# Patient Record
Sex: Female | Born: 1965 | Race: White | Hispanic: No | Marital: Single | Smoking: Never smoker
Health system: Southern US, Community
[De-identification: ages and names within clinical notes are randomized; demographics above are authoritative.]

## PROBLEM LIST (undated history)

## (undated) DIAGNOSIS — R519 Headache, unspecified: Secondary | ICD-10-CM

## (undated) DIAGNOSIS — I10 Essential (primary) hypertension: Secondary | ICD-10-CM

## (undated) DIAGNOSIS — E039 Hypothyroidism, unspecified: Secondary | ICD-10-CM

## (undated) DIAGNOSIS — R51 Headache: Secondary | ICD-10-CM

## (undated) DIAGNOSIS — R011 Cardiac murmur, unspecified: Secondary | ICD-10-CM

## (undated) DIAGNOSIS — M1712 Unilateral primary osteoarthritis, left knee: Secondary | ICD-10-CM

## (undated) DIAGNOSIS — R112 Nausea with vomiting, unspecified: Secondary | ICD-10-CM

## (undated) DIAGNOSIS — Z973 Presence of spectacles and contact lenses: Secondary | ICD-10-CM

## (undated) DIAGNOSIS — E669 Obesity, unspecified: Secondary | ICD-10-CM

## (undated) DIAGNOSIS — Z9889 Other specified postprocedural states: Secondary | ICD-10-CM

## (undated) HISTORY — PX: BREAST REDUCTION SURGERY: SHX8

## (undated) HISTORY — PX: HEMORROIDECTOMY: SUR656

## (undated) HISTORY — PX: TONSILLECTOMY: SUR1361

## (undated) HISTORY — PX: ANTERIOR CRUCIATE LIGAMENT REPAIR: SHX115

## (undated) HISTORY — PX: COLONOSCOPY W/ BIOPSIES AND POLYPECTOMY: SHX1376

---

## 2002-06-09 ENCOUNTER — Inpatient Hospital Stay (HOSPITAL_COMMUNITY): Admission: AD | Admit: 2002-06-09 | Discharge: 2002-06-11 | Payer: Self-pay | Admitting: Obstetrics & Gynecology

## 2002-06-12 ENCOUNTER — Encounter: Admission: RE | Admit: 2002-06-12 | Discharge: 2002-07-12 | Payer: Self-pay | Admitting: Obstetrics and Gynecology

## 2002-07-24 ENCOUNTER — Other Ambulatory Visit: Admission: RE | Admit: 2002-07-24 | Discharge: 2002-07-24 | Payer: Self-pay | Admitting: Obstetrics & Gynecology

## 2004-01-13 ENCOUNTER — Other Ambulatory Visit: Admission: RE | Admit: 2004-01-13 | Discharge: 2004-01-13 | Payer: Self-pay | Admitting: Obstetrics & Gynecology

## 2007-06-27 ENCOUNTER — Encounter: Admission: RE | Admit: 2007-06-27 | Discharge: 2007-06-27 | Payer: Self-pay | Admitting: Obstetrics & Gynecology

## 2011-05-06 NOTE — H&P (Signed)
[facility]  PatientMYKELA, [name] Visit Number: [id number] MRN: [id number]          Service Type: OBS Location: 910A [id number] [id number] Attending Physician:  Lenoard Aden Dictated by:   Lenoard Aden, M.D. Admit Date:  06/09/2002   CC:         [facility]   History and Physical  CHIEF COMPLAINT:  Spontaneous fluid leakage.  HISTORY OF PRESENT ILLNESS: The patient is a 45 year old white female G1, P0 [facility] June 11, 2002, at 39 plus weeks with spontaneous rupture of membranes in active labor at approximately 4 a.m.  PAST MEDICAL HISTORY: 1. Remarkable for UTI 2. Tonsillectomy. 3. Left knee ACL repair.  FAMILY HISTORY:  Kidney disease, migraine headaches, hypertension and diabetes.  PRENATAL LABORATORY DATA:  Reveals a blood type of A positive, Rh antibody negative, rubella immune, HBsAg negative, HIV nonreactive.  PHYSICAL EXAMINATION:  GENERAL:  She is well-developed well-nourished white female in no acute distress.  HEENT:  Normal.  LUNGS:  Clear.  HEART:  Regular rate and rhythm.  ABDOMEN: Soft, gravid, nontender.  ESTIMATED FETAL WEIGHT:  7 pounds.  CERVIX:  8 cm., 100 vertex, and plus 1.  IMPRESSION:  Term pregnancy in active labor.  PLAN:  Anticipate attempts at vaginal delivery. Dictated by:   Lenoard Aden, M.D. Attending Physician:  Lenoard Aden DD:  06/09/02 TD:  06/09/02 Job: [id number]

## 2016-10-24 DIAGNOSIS — E785 Hyperlipidemia, unspecified: Secondary | ICD-10-CM | POA: Diagnosis present

## 2016-10-24 DIAGNOSIS — I10 Essential (primary) hypertension: Secondary | ICD-10-CM | POA: Diagnosis present

## 2016-10-24 DIAGNOSIS — M1712 Unilateral primary osteoarthritis, left knee: Secondary | ICD-10-CM | POA: Diagnosis present

## 2016-10-24 NOTE — H&P (Signed)
PREOPERATIVE H&P Patient ID: Florene Glenracy E Deroche MRN: [id number] DOB/AGE: 51/06/1966 51 y.o.  Chief Complaint: OA LEFT KNEE  Planned Procedure Date: 11/15/16 Medical Clearance by Dr. Willa RoughHicks  HPI: Florene Glenracy E Eunice is a 50 y.o. female with a history of recent hemorrhoid surgery, HTN and HLD who presents for evaluation of OA LEFT KNEE. This is a chronic nontraumatic problem due to arthritis that has been going on for many years.  She has a history of ACL reconstruction on the left knee back in 2000 and arthroscopy for meniscal damage on the right knee in December of 2007.  Over the past three years she has had significantly decreasing ability to perform her ADLs.  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  She denies history of MRSA, DVT, MI, CVA.  She does have a personal history of hypertension.  She works in Agricultural engineerclinical research for [employer] and is a nonsmoker.  She has failed non-surgical conservative treatments for greater than 12 weeks to include multiple corticosteroid injections, viscosupplementation injections, NSAIDs, activity modification, weight loss of about 65 pounds.   Patient currently rates pain at 7 out of 10 with activity. Patient has night pain, worsening of pain with activity and weight bearing and pain that interferes with activities of daily living.  Patient has evidence of severe Bilateral tricompartmental osteoarthritis of the knees, more significant on the left by imaging studies. There is no active infection.  Past medical history: Essential hypertension, hyperlipidemia.  Past surgical history: Tonsillectomy 1996, Left ACL Repair 2000, Right Knee Arthroscopy 1997, Breast red. / tummy tuck 2004, Hemorroidectomy 04/2016.  Allergies: Codeine / Morphine cause Nausea  Medications: Amlodipine 5mg  qday Phentermine 37.5mg  qday Estradiol 1mg   qday Progesterone 100mg  qday Nature-thyroid 32.5mg  3 qday Doxycycline 20mg  1 bid prn for dermatological condition Fiber gummies 4 qday Miralax 17g qday Meclizine 25mg  prn Promethazine 25mg  prn Supplements: DHEA 25mg  qday VitK2 Mk-7 150mcg qday Vit D3 1 qday Tumeric 500mg  BID A-Drenae 2 qday Berberine 450mg  BID Pregmenolone 75mg  qday  Social History   Social History  . Marital status: Married    Spouse name: N/A  . Number of children: N/A  . Years of education: N/A   Social History Main Topics  . Smoking status: Never smoker  . Smokeless tobacco: Not on file  . Alcohol use Not on file  . Drug use: Unknown  . Sexual activity: Not on file   Other Topics Concern  . Not on file   Social History Narrative  . No narrative on file   Family history: Mother and Father w/ DM.  Father w/ HTN.  Sister with HTN and DM.  Grandparents with CV disease and h/o MI.  ROS: Currently denies lightheadedness, dizziness, Fever, chills, CP, SOB.  No personal history of DVT, PE, MI, or CVA. No loose teeth or dentures All other systems have been reviewed and were otherwise currently negative with the exception of those mentioned in the HPI and as above.  Objective: Vitals: Ht: 5'9" Wt: 223 Temp: 98.8 BP: 128/86 Pulse: 78 O2 97% on room air. Physical Exam: General: Alert, NAD.  Antalgic gait HEENT: EOMI, Good Neck Extension  Pulm: No increased work of breathing.  Clear B/L A/P w/o crackle or wheeze.  CV: RRR, No m/g/r appreciated  GI: soft, NT, ND Neuro: Neuro grossly intact b/l  upper/lower ext.  Sensation intact distally Skin: No lesions in the area of chief complaint MSK/Surgical Site: Left knee w/o redness or effusion. ROM 0-120.  5/5 strength in extension and flexion.  +EHL/FHL.  NVI.  Stable Lachman's and varus and valgus stress.   Imaging Review Plain radiographs demonstrate severe Bilateral tricompartmental osteoarthritis of her knees, more significant on the  left.  Assessment: OA LEFT KNEE Principal Problem:   Primary osteoarthritis of left knee Active Problems:   Essential hypertension   Hyperlipidemia  Plan: Plan for Procedure(s): TOTAL KNEE ARTHROPLASTY  The patient history, physical exam, clinical judgement of the provider and imaging are consistent with end stage degenerative joint disease and total joint arthroplasty is deemed medically necessary. The treatment options including medical management, injection therapy, and arthroplasty were discussed at length. The risks and benefits of Procedure(s): TOTAL KNEE ARTHROPLASTY were presented and reviewed.  The risks of nonoperative treatment, versus surgical intervention including but not limited to continued pain, aseptic loosening, stiffness, dislocation/subluxation, infection, bleeding, nerve injury, blood clots, cardiopulmonary complications, morbidity, mortality, among others were discussed. The patient verbalizes understanding and wishes to proceed with the plan.  Patient is being admitted for inpatient treatment for surgery, pain control, PT, OT, prophylactic antibiotics, VTE prophylaxis, progressive ambulation, ADL's and discharge planning.   Dental prophylaxis discussed and recommended for 2 years postoperatively.  The patient does meet the criteria for TXA which will be used perioperatively via IV.   Xarelto  will be used postoperatively for DVT prophylaxis in addition to SCDs, and early ambulation.  Dt use of hormone medication / supplements. The patient is planning to be discharged home with home health services in care of her family. Plan to start aggressive bowel regimen and medication (Naloxegol 25 mg q day) to decrease narcotic induced constipation post op dt recent hemorrhoid surgery and significant pain with constipation.    Albina BilletHenry Calvin Martensen III, PA-C 10/24/2016 8:19 AM

## 2016-11-04 ENCOUNTER — Encounter (HOSPITAL_COMMUNITY)
Admission: RE | Admit: 2016-11-04 | Discharge: 2016-11-04 | Disposition: A | Discharge status: Home / Self Care | Payer: Managed Care, Other (non HMO) | Source: Ambulatory Visit | Attending: Orthopedic Surgery | Admitting: Orthopedic Surgery

## 2016-11-04 ENCOUNTER — Encounter (HOSPITAL_COMMUNITY): Payer: Self-pay | Admitting: *Deleted

## 2016-11-04 DIAGNOSIS — I10 Essential (primary) hypertension: Secondary | ICD-10-CM | POA: Diagnosis not present

## 2016-11-04 DIAGNOSIS — M1712 Unilateral primary osteoarthritis, left knee: Secondary | ICD-10-CM | POA: Insufficient documentation

## 2016-11-04 DIAGNOSIS — Z01812 Encounter for preprocedural laboratory examination: Secondary | ICD-10-CM | POA: Diagnosis not present

## 2016-11-04 DIAGNOSIS — Z0183 Encounter for blood typing: Secondary | ICD-10-CM | POA: Insufficient documentation

## 2016-11-04 DIAGNOSIS — E039 Hypothyroidism, unspecified: Secondary | ICD-10-CM | POA: Diagnosis not present

## 2016-11-04 DIAGNOSIS — Z79899 Other long term (current) drug therapy: Secondary | ICD-10-CM | POA: Diagnosis not present

## 2016-11-04 DIAGNOSIS — Z01818 Encounter for other preprocedural examination: Secondary | ICD-10-CM | POA: Insufficient documentation

## 2016-11-04 HISTORY — DX: Obesity, unspecified: E66.9

## 2016-11-04 HISTORY — DX: Unilateral primary osteoarthritis, left knee: M17.12

## 2016-11-04 HISTORY — DX: Headache: R51

## 2016-11-04 HISTORY — DX: Hypothyroidism, unspecified: E03.9

## 2016-11-04 HISTORY — DX: Other specified postprocedural states: R11.2

## 2016-11-04 HISTORY — DX: Cardiac murmur, unspecified: R01.1

## 2016-11-04 HISTORY — DX: Headache, unspecified: R51.9

## 2016-11-04 HISTORY — DX: Essential (primary) hypertension: I10

## 2016-11-04 HISTORY — DX: Other specified postprocedural states: Z98.890

## 2016-11-04 HISTORY — DX: Presence of spectacles and contact lenses: Z97.3

## 2016-11-04 LAB — URINALYSIS, ROUTINE W REFLEX MICROSCOPIC
BILIRUBIN URINE: NEGATIVE
Glucose, UA: NEGATIVE mg/dL
Hgb urine dipstick: NEGATIVE
KETONES UR: NEGATIVE mg/dL
Leukocytes, UA: NEGATIVE
NITRITE: NEGATIVE
Protein, ur: NEGATIVE mg/dL
Specific Gravity, Urine: 1.006 (ref 1.005–1.030)
pH: 6 (ref 5.0–8.0)

## 2016-11-04 LAB — CBC
HCT: 43.2 % (ref 36.0–46.0)
HEMOGLOBIN: 14.4 g/dL (ref 12.0–15.0)
MCH: 29.8 pg (ref 26.0–34.0)
MCHC: 33.3 g/dL (ref 30.0–36.0)
MCV: 89.3 fL (ref 78.0–100.0)
Platelets: 366 10*3/uL (ref 150–400)
RBC: 4.84 MIL/uL (ref 3.87–5.11)
RDW: 13.8 % (ref 11.5–15.5)
WBC: 9 10*3/uL (ref 4.0–10.5)

## 2016-11-04 LAB — LIPID PANEL
CHOLESTEROL: 214 mg/dL — AB (ref 0–200)
HDL: 63 mg/dL (ref >40)
LDL Cholesterol: 141 mg/dL — ABNORMAL HIGH (ref 0–99)
TRIGLYCERIDES: 49 mg/dL (ref <150)
Total CHOL/HDL Ratio: 3.4 RATIO
VLDL: 10 mg/dL (ref 0–40)

## 2016-11-04 LAB — APTT: aPTT: 32 seconds (ref 24–36)

## 2016-11-04 LAB — SURGICAL PCR SCREEN
MRSA, PCR: NEGATIVE
Staphylococcus aureus: NEGATIVE

## 2016-11-04 LAB — BASIC METABOLIC PANEL
ANION GAP: 9 (ref 5–15)
BUN: 16 mg/dL (ref 6–20)
CHLORIDE: 102 mmol/L (ref 101–111)
CO2: 26 mmol/L (ref 22–32)
CREATININE: 0.94 mg/dL (ref 0.44–1.00)
Calcium: 9.7 mg/dL (ref 8.9–10.3)
GFR calc non Af Amer: >60 mL/min (ref >60)
Glucose, Bld: 104 mg/dL — ABNORMAL HIGH (ref 65–99)
Potassium: 4.3 mmol/L (ref 3.5–5.1)
Sodium: 137 mmol/L (ref 135–145)

## 2016-11-04 LAB — ABO/RH: ABO/RH(D): A POS

## 2016-11-04 LAB — TYPE AND SCREEN
ABO/RH(D): A POS
ANTIBODY SCREEN: NEGATIVE

## 2016-11-04 LAB — PROTIME-INR
INR: 1
Prothrombin Time: 13.2 seconds (ref 11.4–15.2)

## 2016-11-04 NOTE — Pre-Procedure Instructions (Addendum)
Jones Broomracy E Recendiz  11/04/2016      [facility] [phone] - [place], [place] - [address] AT [facility] [address] [place] [id number] Phone: [phone] Fax: [phone]    Your procedure is scheduled on Tuesday, November 15, 2016  [facility] Admitting at 5:30 A.M.  Call this number if you have problems the morning of surgery:  [phone]   Remember:  Do not eat food or drink liquids after midnight Monday, November 14, 2016  Take these medicines the morning of surgery with A SIP OF WATER : amLODipine (NORVASC),NATURE-THROID,  if needed: Tylenol, Flonase nasal spray, meclizine (ANTIVERT), promethazine (PHENERGAN)  Stop taking Aspirin, vitamins, fish oil, phentermine (ADIPEX-P),  and herbal medications such as  Nutritional Supplements (DHEA),  Pregnenolone Powder, TURMERIC  . Do not take any NSAIDs ie: Ibuprofen, Advil, Naproxen , BC and Goody Powder or any medication containing Aspirin such as DUEXIS ; stop now.  Do not wear jewelry, make-up or nail polish.  Do not wear lotions, powders, or perfumes, or deoderant.  Do not shave 48 hours prior to surgery.    Do not bring valuables to the hospital.  [facility] is not responsible for any belongings or valuables.  Contacts, dentures or bridgework may not be worn into surgery.  Leave your suitcase in the car.  After surgery it may be brought to your room.  For patients admitted to the hospital, discharge time will be determined by your treatment team.  Special instructions:  [place] - Preparing for Surgery  Before surgery, you can play an important role.  Because skin is not sterile, your skin needs to be as free of germs as possible.  You can reduce the number of germs on you skin by washing with CHG (chlorahexidine gluconate) soap before surgery.  CHG is an antiseptic cleaner which kills germs and bonds with the skin to continue killing germs even after washing.  Please  DO NOT use if you have an allergy to CHG or antibacterial soaps.  If your skin becomes reddened/irritated stop using the CHG and inform your nurse when you arrive at Short Stay.  Do not shave (including legs and underarms) for at least 48 hours prior to the first CHG shower.  You may shave your face.  Please follow these instructions carefully:   1.  Shower with CHG Soap the night before surgery and the morning of Surgery.  2.  If you choose to wash your hair, wash your hair first as usual with your normal shampoo.  3.  After you shampoo, rinse your hair and body thoroughly to remove the Shampoo.  4.  Use CHG as you would any other liquid soap.  You can apply chg directly  to the skin and wash gently with scrungie or a clean washcloth.  5.  Apply the CHG Soap to your body ONLY FROM THE NECK DOWN.  Do not use on open wounds or open sores.  Avoid contact with your eyes, ears, mouth and genitals (private parts).  Wash genitals (private parts) with your normal soap.  6.  Wash thoroughly, paying special attention to the area where your surgery will be performed.  7.  Thoroughly rinse your body with warm water from the neck down.  8.  DO NOT shower/wash with your normal soap after using and rinsing off the CHG Soap.  9.  Pat yourself dry with a clean  towel.            10.  Wear clean pajamas.            11.  Place clean sheets on your bed the night of your first shower and do not sleep with pets.  Day of Surgery  Do not apply any lotions/deoderants the morning of surgery.  Please wear clean clothes to the hospital/surgery center.  Please read over the following fact sheets that you were given. Pain Booklet, Coughing and Deep Breathing, Blood Transfusion Information, Total Joint Packet, MRSA Information and Surgical Site Infection Prevention

## 2016-11-04 NOTE — Progress Notes (Signed)
Pt denies having a chest x ray within the last year. 

## 2016-11-04 NOTE — Progress Notes (Signed)
Pt denies SOB, chest pain, and being under the care of a cardiologist. Pt denies having a cardiac cath and stress test. [facility] 05/31/16. Left voice message with Sherrie, Surgical Coordinator Tresa Endo( Kelly is out of office today), to clarify order for Ancef and knee x ray. Pt chart forwarded to anesthesia to review clearance note in epic.

## 2016-11-05 LAB — URINE CULTURE: CULTURE: NO GROWTH

## 2016-11-07 NOTE — Progress Notes (Signed)
Anesthesia Chart Review:  Pt is a 50 year old female scheduled for L total knee arthroplasty on 11/15/2016 with Margarita Ranaimothy Murphy, MD.  - PCP is Vinnie LevelKristin Hicks, MD who cleared pt at last office visit 10/18/16 (notes in care everywhere).  PMH includes:  HTN, heart murmur, hypothyroidism, post-op N/V. Never smoker. BMI 33.5  Medications include: amlodipine, duexis, phentermine, pregnenolone  Preoperative labs reviewed.    EKG 05/31/16: Atrial fibrillation with a competing junctional pacemaker. Cannot rule out anterior infarct, age undetermined.   Echo 08/03/16: Normal Echocardiogram. No significant valvular abnormalities.  Rica Mastngela Kabbe, FNP-BC [facility]/Anesthesiology Phone: [phone] 11/07/2016 4:57 PM

## 2016-11-14 MED ORDER — TRANEXAMIC ACID 1000 MG/10ML IV SOLN
1000.0000 mg | INTRAVENOUS | Status: AC
  Administered 2016-11-15: 1000 mg via INTRAVENOUS
  Filled 2016-11-14: qty 10

## 2016-11-15 ENCOUNTER — Inpatient Hospital Stay (HOSPITAL_COMMUNITY): Payer: Managed Care, Other (non HMO) | Admitting: Anesthesiology

## 2016-11-15 ENCOUNTER — Inpatient Hospital Stay (HOSPITAL_COMMUNITY): Payer: Managed Care, Other (non HMO)

## 2016-11-15 ENCOUNTER — Encounter (HOSPITAL_COMMUNITY): Payer: Self-pay | Admitting: Urology

## 2016-11-15 ENCOUNTER — Inpatient Hospital Stay (HOSPITAL_COMMUNITY)
Admission: RE | Admit: 2016-11-15 | Discharge: 2016-11-16 | DRG: 470 | Disposition: A | Discharge status: Home Health | Payer: Managed Care, Other (non HMO) | Source: Ambulatory Visit | Attending: Orthopedic Surgery | Admitting: Orthopedic Surgery

## 2016-11-15 ENCOUNTER — Encounter (HOSPITAL_COMMUNITY)
Admission: RE | Disposition: A | Discharge status: Home Health | Payer: Self-pay | Source: Ambulatory Visit | Attending: Orthopedic Surgery

## 2016-11-15 ENCOUNTER — Inpatient Hospital Stay (HOSPITAL_COMMUNITY): Payer: Managed Care, Other (non HMO) | Admitting: Vascular Surgery

## 2016-11-15 DIAGNOSIS — M1712 Unilateral primary osteoarthritis, left knee: Principal | ICD-10-CM | POA: Diagnosis present

## 2016-11-15 DIAGNOSIS — E669 Obesity, unspecified: Secondary | ICD-10-CM | POA: Diagnosis present

## 2016-11-15 DIAGNOSIS — Z885 Allergy status to narcotic agent status: Secondary | ICD-10-CM

## 2016-11-15 DIAGNOSIS — Z79899 Other long term (current) drug therapy: Secondary | ICD-10-CM | POA: Diagnosis not present

## 2016-11-15 DIAGNOSIS — Z96652 Presence of left artificial knee joint: Secondary | ICD-10-CM

## 2016-11-15 DIAGNOSIS — E785 Hyperlipidemia, unspecified: Secondary | ICD-10-CM | POA: Diagnosis present

## 2016-11-15 DIAGNOSIS — Z7901 Long term (current) use of anticoagulants: Secondary | ICD-10-CM

## 2016-11-15 DIAGNOSIS — I10 Essential (primary) hypertension: Secondary | ICD-10-CM | POA: Diagnosis present

## 2016-11-15 DIAGNOSIS — Z6833 Body mass index (BMI) 33.0-33.9, adult: Secondary | ICD-10-CM

## 2016-11-15 HISTORY — PX: TOTAL KNEE ARTHROPLASTY: SHX125

## 2016-11-15 SURGERY — ARTHROPLASTY, KNEE, TOTAL
Anesthesia: Spinal | Site: Knee | Laterality: Left

## 2016-11-15 MED ORDER — ONDANSETRON HCL 4 MG PO TABS: 4.0000 mg | ORAL_TABLET | Freq: Three times a day (TID) | ORAL | 0 refills | Status: AC | PRN

## 2016-11-15 MED ORDER — HYDROMORPHONE HCL 1 MG/ML IJ SOLN
INTRAMUSCULAR | Status: AC
  Filled 2016-11-15: qty 0.5

## 2016-11-15 MED ORDER — METOCLOPRAMIDE HCL 5 MG PO TABS
5.0000 mg | ORAL_TABLET | Freq: Three times a day (TID) | ORAL | Status: DC | PRN
  Administered 2016-11-15: 10 mg via ORAL

## 2016-11-15 MED ORDER — SCOPOLAMINE 1 MG/3DAYS TD PT72
MEDICATED_PATCH | TRANSDERMAL | Status: AC
  Filled 2016-11-15: qty 1

## 2016-11-15 MED ORDER — MORPHINE SULFATE (PF) 2 MG/ML IV SOLN
2.0000 mg | INTRAVENOUS | Status: DC | PRN
  Administered 2016-11-15: 2 mg via INTRAVENOUS
  Filled 2016-11-15: qty 1

## 2016-11-15 MED ORDER — PHENYLEPHRINE HCL 10 MG/ML IJ SOLN
INTRAVENOUS | Status: DC | PRN
  Administered 2016-11-15: 25 ug/min via INTRAVENOUS

## 2016-11-15 MED ORDER — RIVAROXABAN 10 MG PO TABS: 10.0000 mg | ORAL_TABLET | Freq: Every day | ORAL | 0 refills | Status: AC

## 2016-11-15 MED ORDER — OXYCODONE HCL 5 MG PO TABS
ORAL_TABLET | ORAL | Status: AC
  Filled 2016-11-15: qty 1

## 2016-11-15 MED ORDER — GABAPENTIN 300 MG PO CAPS
ORAL_CAPSULE | ORAL | Status: AC
  Administered 2016-11-15: 300 mg via ORAL
  Filled 2016-11-15: qty 1

## 2016-11-15 MED ORDER — 0.9 % SODIUM CHLORIDE (POUR BTL) OPTIME
TOPICAL | Status: DC | PRN
  Administered 2016-11-15: 1000 mL

## 2016-11-15 MED ORDER — AMLODIPINE BESYLATE 5 MG PO TABS
5.0000 mg | ORAL_TABLET | Freq: Every day | ORAL | Status: DC
  Administered 2016-11-16: 5 mg via ORAL
  Filled 2016-11-15: qty 1

## 2016-11-15 MED ORDER — THYROID 30 MG PO TABS
97.5000 mg | ORAL_TABLET | Freq: Every day | ORAL | Status: DC
  Filled 2016-11-15: qty 1

## 2016-11-15 MED ORDER — DOCUSATE SODIUM 100 MG PO CAPS
100.0000 mg | ORAL_CAPSULE | Freq: Two times a day (BID) | ORAL | Status: DC
  Administered 2016-11-16: 100 mg via ORAL
  Filled 2016-11-15 (×2): qty 1

## 2016-11-15 MED ORDER — DEXAMETHASONE SODIUM PHOSPHATE 10 MG/ML IJ SOLN
10.0000 mg | Freq: Once | INTRAMUSCULAR | Status: AC
  Administered 2016-11-16: 10 mg via INTRAVENOUS
  Filled 2016-11-15: qty 1

## 2016-11-15 MED ORDER — CEFAZOLIN SODIUM-DEXTROSE 2-4 GM/100ML-% IV SOLN
2.0000 g | Freq: Four times a day (QID) | INTRAVENOUS | Status: AC
  Administered 2016-11-15 (×2): 2 g via INTRAVENOUS
  Filled 2016-11-15 (×2): qty 100

## 2016-11-15 MED ORDER — ACETAMINOPHEN 325 MG PO TABS: 650.0000 mg | ORAL_TABLET | Freq: Four times a day (QID) | ORAL | Status: DC | PRN

## 2016-11-15 MED ORDER — DOCUSATE SODIUM 100 MG PO CAPS: 100.0000 mg | ORAL_CAPSULE | Freq: Two times a day (BID) | ORAL | 0 refills | Status: AC

## 2016-11-15 MED ORDER — FENTANYL CITRATE (PF) 100 MCG/2ML IJ SOLN
INTRAMUSCULAR | Status: DC | PRN
  Administered 2016-11-15 (×2): 50 ug via INTRAVENOUS

## 2016-11-15 MED ORDER — PROGESTERONE MICRONIZED 200 MG PO CAPS
200.0000 mg | ORAL_CAPSULE | Freq: Every day | ORAL | Status: DC
  Administered 2016-11-15: 200 mg via ORAL
  Filled 2016-11-15 (×2): qty 1

## 2016-11-15 MED ORDER — HYDROMORPHONE HCL 1 MG/ML IJ SOLN
INTRAMUSCULAR | Status: AC
  Filled 2016-11-15: qty 1

## 2016-11-15 MED ORDER — DIPHENHYDRAMINE HCL 12.5 MG/5ML PO ELIX: 12.5000 mg | ORAL_SOLUTION | ORAL | Status: DC | PRN

## 2016-11-15 MED ORDER — METOCLOPRAMIDE HCL 5 MG/ML IJ SOLN
5.0000 mg | Freq: Three times a day (TID) | INTRAMUSCULAR | Status: DC | PRN
  Filled 2016-11-15: qty 2

## 2016-11-15 MED ORDER — HYDROCORTISONE 2.5 % RE CREA
TOPICAL_CREAM | Freq: Three times a day (TID) | RECTAL | Status: DC | PRN
  Filled 2016-11-15: qty 28.35

## 2016-11-15 MED ORDER — ONDANSETRON HCL 4 MG PO TABS: 4.0000 mg | ORAL_TABLET | Freq: Four times a day (QID) | ORAL | Status: DC | PRN

## 2016-11-15 MED ORDER — SENNA 8.6 MG PO TABS
1.0000 | ORAL_TABLET | Freq: Two times a day (BID) | ORAL | Status: DC
  Administered 2016-11-15 – 2016-11-16 (×2): 8.6 mg via ORAL
  Filled 2016-11-15 (×2): qty 1

## 2016-11-15 MED ORDER — NALOXEGOL OXALATE 25 MG PO TABS
25.0000 mg | ORAL_TABLET | Freq: Every day | ORAL | Status: DC
  Administered 2016-11-15 – 2016-11-16 (×2): 25 mg via ORAL
  Filled 2016-11-15 (×2): qty 1

## 2016-11-15 MED ORDER — ACETAMINOPHEN 500 MG PO TABS
ORAL_TABLET | ORAL | Status: AC
  Administered 2016-11-15: 1000 mg via ORAL
  Filled 2016-11-15: qty 2

## 2016-11-15 MED ORDER — PROMETHAZINE HCL 25 MG/ML IJ SOLN: 6.2500 mg | INTRAMUSCULAR | Status: DC | PRN

## 2016-11-15 MED ORDER — ESTRADIOL 1 MG PO TABS
1.0000 mg | ORAL_TABLET | Freq: Every day | ORAL | Status: DC
  Administered 2016-11-15: 1 mg via ORAL
  Filled 2016-11-15 (×2): qty 1

## 2016-11-15 MED ORDER — KETOROLAC TROMETHAMINE 30 MG/ML IJ SOLN
INTRAMUSCULAR | Status: AC
  Filled 2016-11-15: qty 1

## 2016-11-15 MED ORDER — KETOROLAC TROMETHAMINE 15 MG/ML IJ SOLN
15.0000 mg | Freq: Four times a day (QID) | INTRAMUSCULAR | Status: AC
  Administered 2016-11-15 – 2016-11-16 (×3): 15 mg via INTRAVENOUS
  Filled 2016-11-15 (×3): qty 1

## 2016-11-15 MED ORDER — OXYCODONE HCL 5 MG PO TABS
5.0000 mg | ORAL_TABLET | ORAL | Status: DC | PRN
  Administered 2016-11-15 (×2): 10 mg via ORAL
  Administered 2016-11-15: 5 mg via ORAL
  Administered 2016-11-16 (×5): 10 mg via ORAL
  Filled 2016-11-15 (×7): qty 2

## 2016-11-15 MED ORDER — METHOCARBAMOL 500 MG PO TABS: 500.0000 mg | ORAL_TABLET | Freq: Four times a day (QID) | ORAL | 0 refills | Status: AC | PRN

## 2016-11-15 MED ORDER — PHENOL 1.4 % MT LIQD: 1.0000 | OROMUCOSAL | Status: DC | PRN

## 2016-11-15 MED ORDER — LACTATED RINGERS IV SOLN
INTRAVENOUS | Status: DC
  Administered 2016-11-15: 08:00:00 via INTRAVENOUS

## 2016-11-15 MED ORDER — ONDANSETRON HCL 4 MG/2ML IJ SOLN
INTRAMUSCULAR | Status: DC | PRN
Start: 2016-11-15 — End: 2016-11-15
  Administered 2016-11-15: 4 mg via INTRAVENOUS

## 2016-11-15 MED ORDER — SODIUM CHLORIDE 0.9 % IJ SOLN
INTRAMUSCULAR | Status: AC
  Filled 2016-11-15: qty 10

## 2016-11-15 MED ORDER — DEXTROSE-NACL 5-0.45 % IV SOLN
INTRAVENOUS | Status: AC
  Administered 2016-11-15: 14:00:00 via INTRAVENOUS

## 2016-11-15 MED ORDER — RIVAROXABAN 10 MG PO TABS
10.0000 mg | ORAL_TABLET | Freq: Every day | ORAL | Status: DC
  Administered 2016-11-16: 10 mg via ORAL
  Filled 2016-11-15: qty 1

## 2016-11-15 MED ORDER — PROPOFOL 10 MG/ML IV BOLUS
INTRAVENOUS | Status: AC
  Filled 2016-11-15: qty 20

## 2016-11-15 MED ORDER — CELECOXIB 200 MG PO CAPS
200.0000 mg | ORAL_CAPSULE | Freq: Two times a day (BID) | ORAL | Status: DC
  Administered 2016-11-15 – 2016-11-16 (×2): 200 mg via ORAL
  Filled 2016-11-15 (×3): qty 1

## 2016-11-15 MED ORDER — NALOXEGOL OXALATE 25 MG PO TABS: 25.0000 mg | ORAL_TABLET | Freq: Every day | ORAL | 1 refills | Status: AC

## 2016-11-15 MED ORDER — METHOCARBAMOL 500 MG PO TABS
500.0000 mg | ORAL_TABLET | Freq: Four times a day (QID) | ORAL | Status: DC | PRN
  Administered 2016-11-15 – 2016-11-16 (×4): 500 mg via ORAL
  Filled 2016-11-15 (×4): qty 1

## 2016-11-15 MED ORDER — KETOROLAC TROMETHAMINE 30 MG/ML IJ SOLN
INTRAMUSCULAR | Status: DC | PRN
  Administered 2016-11-15: 30 mg

## 2016-11-15 MED ORDER — BUPIVACAINE HCL (PF) 0.75 % IJ SOLN
INTRAMUSCULAR | Status: DC | PRN
  Administered 2016-11-15: 1.8 mL via INTRATHECAL

## 2016-11-15 MED ORDER — ACETAMINOPHEN 500 MG PO TABS
1000.0000 mg | ORAL_TABLET | Freq: Once | ORAL | Status: AC
  Administered 2016-11-15: 1000 mg via ORAL

## 2016-11-15 MED ORDER — MIDAZOLAM HCL 2 MG/2ML IJ SOLN
INTRAMUSCULAR | Status: AC
  Filled 2016-11-15: qty 2

## 2016-11-15 MED ORDER — CHLORHEXIDINE GLUCONATE 4 % EX LIQD: 60.0000 mL | Freq: Once | CUTANEOUS | Status: DC

## 2016-11-15 MED ORDER — PROPOFOL 500 MG/50ML IV EMUL
INTRAVENOUS | Status: DC | PRN
  Administered 2016-11-15: 100 ug/kg/min via INTRAVENOUS

## 2016-11-15 MED ORDER — HYDROCORTISONE 2.5 % EX CREA: 1.0000 "application " | TOPICAL_CREAM | Freq: Three times a day (TID) | CUTANEOUS | Status: DC | PRN

## 2016-11-15 MED ORDER — FENTANYL CITRATE (PF) 100 MCG/2ML IJ SOLN
INTRAMUSCULAR | Status: AC
  Filled 2016-11-15: qty 2

## 2016-11-15 MED ORDER — CEFAZOLIN SODIUM-DEXTROSE 2-4 GM/100ML-% IV SOLN
2.0000 g | INTRAVENOUS | Status: AC
  Administered 2016-11-15: 2 g via INTRAVENOUS

## 2016-11-15 MED ORDER — OXYCODONE-ACETAMINOPHEN 5-325 MG PO TABS: 1.0000 | ORAL_TABLET | ORAL | 0 refills | Status: AC | PRN

## 2016-11-15 MED ORDER — BUPIVACAINE HCL (PF) 0.25 % IJ SOLN
INTRAMUSCULAR | Status: AC
  Filled 2016-11-15: qty 30

## 2016-11-15 MED ORDER — MIDAZOLAM HCL 5 MG/5ML IJ SOLN
INTRAMUSCULAR | Status: DC | PRN
  Administered 2016-11-15: 2 mg via INTRAVENOUS

## 2016-11-15 MED ORDER — ONDANSETRON HCL 4 MG/2ML IJ SOLN
4.0000 mg | Freq: Four times a day (QID) | INTRAMUSCULAR | Status: DC | PRN
  Administered 2016-11-15 – 2016-11-16 (×2): 4 mg via INTRAVENOUS
  Filled 2016-11-15 (×2): qty 2

## 2016-11-15 MED ORDER — METHOCARBAMOL 500 MG PO TABS
ORAL_TABLET | ORAL | Status: AC
  Filled 2016-11-15: qty 1

## 2016-11-15 MED ORDER — MENTHOL 3 MG MT LOZG: 1.0000 | LOZENGE | OROMUCOSAL | Status: DC | PRN

## 2016-11-15 MED ORDER — GLYCOPYRROLATE 0.2 MG/ML IJ SOLN
0.4000 mg | Freq: Once | INTRAMUSCULAR | Status: AC
  Administered 2016-11-15: 0.4 mg via INTRAVENOUS

## 2016-11-15 MED ORDER — SORBITOL 70 % SOLN: 30.0000 mL | Freq: Every day | Status: DC | PRN

## 2016-11-15 MED ORDER — GLYCOPYRROLATE 0.2 MG/ML IJ SOLN
INTRAMUSCULAR | Status: AC
  Filled 2016-11-15: qty 2

## 2016-11-15 MED ORDER — BUPIVACAINE HCL (PF) 0.25 % IJ SOLN
INTRAMUSCULAR | Status: DC | PRN
  Administered 2016-11-15: 30 mL

## 2016-11-15 MED ORDER — SODIUM CHLORIDE FLUSH 0.9 % IV SOLN
INTRAVENOUS | Status: DC | PRN
  Administered 2016-11-15: 30 mL

## 2016-11-15 MED ORDER — HYDROMORPHONE HCL 1 MG/ML IJ SOLN
0.2500 mg | INTRAMUSCULAR | Status: DC | PRN
  Administered 2016-11-15 (×2): 0.25 mg via INTRAVENOUS
  Administered 2016-11-15 (×3): 0.5 mg via INTRAVENOUS

## 2016-11-15 MED ORDER — GABAPENTIN 300 MG PO CAPS
300.0000 mg | ORAL_CAPSULE | Freq: Once | ORAL | Status: AC
  Administered 2016-11-15: 300 mg via ORAL

## 2016-11-15 MED ORDER — SODIUM CHLORIDE 0.9 % IR SOLN
Status: DC | PRN
  Administered 2016-11-15: 3000 mL

## 2016-11-15 MED ORDER — METHOCARBAMOL 1000 MG/10ML IJ SOLN
500.0000 mg | Freq: Four times a day (QID) | INTRAVENOUS | Status: DC | PRN
  Filled 2016-11-15: qty 5

## 2016-11-15 MED ORDER — LIDOCAINE 5 % EX OINT
TOPICAL_OINTMENT | Freq: Three times a day (TID) | CUTANEOUS | Status: DC | PRN
  Filled 2016-11-15: qty 35.44

## 2016-11-15 MED ORDER — SCOPOLAMINE 1 MG/3DAYS TD PT72
MEDICATED_PATCH | TRANSDERMAL | Status: DC | PRN
  Administered 2016-11-15: 1 via TRANSDERMAL

## 2016-11-15 MED ORDER — ACETAMINOPHEN 325 MG PO TABS
650.0000 mg | ORAL_TABLET | Freq: Four times a day (QID) | ORAL | Status: AC
  Administered 2016-11-15 – 2016-11-16 (×4): 650 mg via ORAL
  Filled 2016-11-15 (×4): qty 2

## 2016-11-15 MED ORDER — ACETAMINOPHEN 650 MG RE SUPP: 650.0000 mg | Freq: Four times a day (QID) | RECTAL | Status: DC | PRN

## 2016-11-15 MED ORDER — POLYETHYLENE GLYCOL 3350 17 G PO PACK: 17.0000 g | PACK | Freq: Every day | ORAL | Status: DC | PRN

## 2016-11-15 MED ORDER — CEFAZOLIN SODIUM-DEXTROSE 2-4 GM/100ML-% IV SOLN
INTRAVENOUS | Status: AC
  Filled 2016-11-15: qty 100

## 2016-11-15 SURGICAL SUPPLY — 65 items
BANDAGE ESMARK 6X9 LF (GAUZE/BANDAGES/DRESSINGS) ×1 IMPLANT
BLADE SAG 18X100X1.27 (BLADE) ×3 IMPLANT
BNDG COHESIVE 6X5 TAN STRL LF (GAUZE/BANDAGES/DRESSINGS) ×3 IMPLANT
BNDG ESMARK 6X9 LF (GAUZE/BANDAGES/DRESSINGS) ×3
BOWL SMART MIX CTS (DISPOSABLE) IMPLANT
CAPT KNEE TOTAL 3 ×3 IMPLANT
CEMENT BONE SIMPLEX SPEEDSET (Cement) ×6 IMPLANT
CEMENT RESTRICTOR BONE PREP ST (KITS) ×4 IMPLANT
CLOSURE STERI-STRIP 1/2X4 (GAUZE/BANDAGES/DRESSINGS) ×1
CLSR STERI-STRIP ANTIMIC 1/2X4 (GAUZE/BANDAGES/DRESSINGS) ×2 IMPLANT
COVER SURGICAL LIGHT HANDLE (MISCELLANEOUS) ×3 IMPLANT
CUFF TOURNIQUET SINGLE 34IN LL (TOURNIQUET CUFF) ×6 IMPLANT
DERMABOND ADVANCED (GAUZE/BANDAGES/DRESSINGS)
DERMABOND ADVANCED .7 DNX12 (GAUZE/BANDAGES/DRESSINGS) IMPLANT
DRAPE EXTREMITY T 121X128X90 (DRAPE) ×3 IMPLANT
DRAPE HALF SHEET 40X57 (DRAPES) ×3 IMPLANT
DRAPE U-SHAPE 47X51 STRL (DRAPES) ×3 IMPLANT
DRSG ADAPTIC 3X8 NADH LF (GAUZE/BANDAGES/DRESSINGS) ×3 IMPLANT
DRSG MEPILEX BORDER 4X8 (GAUZE/BANDAGES/DRESSINGS) ×3 IMPLANT
DURAPREP 26ML APPLICATOR (WOUND CARE) ×6 IMPLANT
ELECT CAUTERY BLADE 6.4 (BLADE) ×3 IMPLANT
ELECT REM PT RETURN 9FT ADLT (ELECTROSURGICAL) ×3
ELECTRODE REM PT RTRN 9FT ADLT (ELECTROSURGICAL) ×1 IMPLANT
FACESHIELD WRAPAROUND (MASK) ×6 IMPLANT
GAUZE SPONGE 4X4 12PLY STRL (GAUZE/BANDAGES/DRESSINGS) ×3 IMPLANT
GLOVE BIO SURGEON STRL SZ7.5 (GLOVE) ×9 IMPLANT
GLOVE BIOGEL PI IND STRL 6.5 (GLOVE) ×2 IMPLANT
GLOVE BIOGEL PI IND STRL 7.5 (GLOVE) ×1 IMPLANT
GLOVE BIOGEL PI IND STRL 8 (GLOVE) ×2 IMPLANT
GLOVE BIOGEL PI INDICATOR 6.5 (GLOVE) ×4
GLOVE BIOGEL PI INDICATOR 7.5 (GLOVE) ×2
GLOVE BIOGEL PI INDICATOR 8 (GLOVE) ×4
GLOVE ECLIPSE 6.0 STRL STRAW (GLOVE) ×3 IMPLANT
GLOVE SURG SS PI 6.5 STRL IVOR (GLOVE) ×6 IMPLANT
GOWN STRL REUS W/ TWL LRG LVL3 (GOWN DISPOSABLE) ×4 IMPLANT
GOWN STRL REUS W/TWL LRG LVL3 (GOWN DISPOSABLE) ×8
HANDPIECE INTERPULSE COAX TIP (DISPOSABLE) ×2
IMMOBILIZER KNEE 22 UNIV (SOFTGOODS) ×3 IMPLANT
IMMOBILIZER KNEE 24 THIGH 36 (MISCELLANEOUS) IMPLANT
IMMOBILIZER KNEE 24 UNIV (MISCELLANEOUS)
KIT BASIN OR (CUSTOM PROCEDURE TRAY) ×3 IMPLANT
KIT BONE PREP STRYKER (KITS) ×2
KIT ROOM TURNOVER OR (KITS) ×3 IMPLANT
MANIFOLD NEPTUNE II (INSTRUMENTS) ×3 IMPLANT
NEEDLE 18GX1X1/2 (RX/OR ONLY) (NEEDLE) ×3 IMPLANT
NS IRRIG 1000ML POUR BTL (IV SOLUTION) ×3 IMPLANT
PACK TOTAL JOINT (CUSTOM PROCEDURE TRAY) ×3 IMPLANT
PACK UNIVERSAL I (CUSTOM PROCEDURE TRAY) ×3 IMPLANT
PAD ARMBOARD 7.5X6 YLW CONV (MISCELLANEOUS) ×3 IMPLANT
SET HNDPC FAN SPRY TIP SCT (DISPOSABLE) ×1 IMPLANT
STAPLER VISISTAT 35W (STAPLE) IMPLANT
SUCTION FRAZIER HANDLE 10FR (MISCELLANEOUS) ×2
SUCTION TUBE FRAZIER 10FR DISP (MISCELLANEOUS) ×1 IMPLANT
SUT MNCRL AB 4-0 PS2 18 (SUTURE) ×3 IMPLANT
SUT MON AB 2-0 CT1 27 (SUTURE) ×6 IMPLANT
SUT MON AB 2-0 CT1 36 (SUTURE) ×3 IMPLANT
SUT VIC AB 0 CT1 27 (SUTURE) ×2
SUT VIC AB 0 CT1 27XBRD ANBCTR (SUTURE) ×1 IMPLANT
SUT VIC AB 1 CTX 36 (SUTURE) ×2
SUT VIC AB 1 CTX36XBRD ANBCTR (SUTURE) ×1 IMPLANT
SYR 50ML LL SCALE MARK (SYRINGE) ×3 IMPLANT
TOWEL OR 17X24 6PK STRL BLUE (TOWEL DISPOSABLE) ×3 IMPLANT
TOWEL OR 17X26 10 PK STRL BLUE (TOWEL DISPOSABLE) ×3 IMPLANT
TRAY CATH 16FR W/PLASTIC CATH (SET/KITS/TRAYS/PACK) ×3 IMPLANT
YANKAUER SUCT BULB TIP NO VENT (SUCTIONS) ×3 IMPLANT

## 2016-11-15 NOTE — Anesthesia Postprocedure Evaluation (Signed)
Anesthesia Post Note  Patient: [name]  Procedure(s) Performed: Procedure(s) (LRB): LEFT TOTAL KNEE ARTHROPLASTY (Left)  Patient location during evaluation: PACU Anesthesia Type: Spinal Level of consciousness: oriented and awake and alert Pain management: pain level controlled Vital Signs Assessment: post-procedure vital signs reviewed and stable Respiratory status: spontaneous breathing, respiratory function stable and patient connected to nasal cannula oxygen Cardiovascular status: blood pressure returned to baseline and stable Postop Assessment: no headache, no backache and spinal receding Anesthetic complications: no Comments: Some mild nausea , being Rxd    Last Vitals:  Vitals:   11/15/16 1319 11/15/16 1335  BP: 115/77 104/69  Pulse: 70 65  Resp: 13 16  Temp:      Last Pain:  Vitals:   11/15/16 1334  TempSrc:   PainSc: 6                  Laurel Harnden,JAMES TERRILL

## 2016-11-15 NOTE — Progress Notes (Signed)
Orthopedic Tech Progress Note Patient Details:  Florene Glenracy E Martello 03/13/1966 [id number]  CPM Left Knee CPM Left Knee: On Left Knee Flexion (Degrees): 90 Left Knee Extension (Degrees): 0 Additional Comments: foot roll   Saul FordyceJennifer C Errin Chewning 11/15/2016, 12:13 PM

## 2016-11-15 NOTE — Anesthesia Preprocedure Evaluation (Addendum)
Anesthesia Evaluation  Patient identified by MRN, date of birth, ID band Patient awake    Reviewed: Allergy & Precautions, NPO status , Patient's Chart, lab work & pertinent test results  History of Anesthesia Complications (+) history of anesthetic complications  Airway Mallampati: I  TM Distance: >3 FB Neck ROM: Full    Dental  (+) Teeth Intact   Pulmonary neg pulmonary ROS,    breath sounds clear to auscultation       Cardiovascular hypertension,  Rhythm:Regular Rate:Normal     Neuro/Psych  Headaches,    GI/Hepatic negative GI ROS, Neg liver ROS,   Endo/Other  Hypothyroidism Morbid obesity  Renal/GU negative Renal ROS     Musculoskeletal  (+) Arthritis ,   Abdominal   Peds  Hematology   Anesthesia Other Findings   Reproductive/Obstetrics                           Anesthesia Physical Anesthesia Plan  ASA: II  Anesthesia Plan: Spinal   Post-op Pain Management:  Regional for Post-op pain   Induction: Intravenous  Airway Management Planned: Simple Face Mask and Natural Airway  Additional Equipment:   Intra-op Plan:   Post-operative Plan: Extubation in OR  Informed Consent: I have reviewed the patients History and Physical, chart, labs and discussed the procedure including the risks, benefits and alternatives for the proposed anesthesia with the patient or authorized representative who has indicated his/her understanding and acceptance.   Dental advisory given  Plan Discussed with:   Anesthesia Plan Comments:        Anesthesia Quick Evaluation

## 2016-11-15 NOTE — Anesthesia Procedure Notes (Signed)
Spinal  Patient location during procedure: OR Start time: 11/15/2016 9:00 AM End time: 11/15/2016 9:10 AM Staffing Anesthesiologist: Sharee HolsterMASSAGEE, Keyanah Kozicki Performed: anesthesiologist  Preanesthetic Checklist Completed: patient identified, site marked, surgical consent, pre-op evaluation, timeout performed, IV checked, risks and benefits discussed and monitors and equipment checked Spinal Block Patient position: sitting Prep: Betadine Patient monitoring: heart rate, cardiac monitor, blood pressure and continuous pulse ox Approach: right paramedian Location: L3-4 Injection technique: single-shot Needle Needle type: Quincke  Needle gauge: 25 G Needle length: 9 cm Needle insertion depth: 8 cm Assessment Sensory level: T6 Additional Notes Tolerated well

## 2016-11-15 NOTE — Op Note (Signed)
DATE OF SURGERY:  11/15/2016 TIME: 10:48 AM  PATIENT NAME:  [name]   AGE: 50 y.o.    PRE-OPERATIVE DIAGNOSIS:  OSTEOARTHRITIS LEFT KNEE  POST-OPERATIVE DIAGNOSIS:  Same  PROCEDURE:  Procedure(s): LEFT TOTAL KNEE ARTHROPLASTY   SURGEON:  MURPHY, TIMOTHY D, MD   ASSISTANT:  Aquilla HackerHenry Martensen, PA-C, he was present and scrubbed throughout the case, critical for completion in a timely fashion, and for retraction, instrumentation, and closure.    OPERATIVE IMPLANTS: Stryker Triathlon Posterior Stabilized.  Femur size 4, Tibia size 4, Patella size 32 3-peg oval button, with a 9 mm polyethylene insert.   PREOPERATIVE INDICATIONS:  [name] is a 50 y.o. year old female with end stage bone on bone degenerative arthritis of the knee who failed conservative treatment, including injections, antiinflammatories, activity modification, and assistive devices, and had significant impairment of their activities of daily living, and elected for Total Knee Arthroplasty.   The risks, benefits, and alternatives were discussed at length including but not limited to the risks of infection, bleeding, nerve injury, stiffness, blood clots, the need for revision surgery, cardiopulmonary complications, among others, and they were willing to proceed.   OPERATIVE DESCRIPTION:  The patient was brought to the operative room and placed in a supine position.  General anesthesia was administered.  IV antibiotics were given.  The lower extremity was prepped and draped in the usual sterile fashion.  Time out was performed.  The leg was elevated and exsanguinated and the tourniquet was inflated.  Anterior approach was performed.  The patella was everted and osteophytes were removed.  The anterior horn of the medial and lateral meniscus was removed.   The distal femur was opened with the drill and the intramedullary distal femoral cutting jig was utilized, set at 5 degrees resecting 8 mm off the distal femur.   Care was taken to protect the collateral ligaments.  The distal femoral sizing jig was applied, taking care to avoid notching.  Then the 4-in-1 cutting jig was applied and the anterior and posterior femur was cut, along with the chamfer cuts.  All posterior osteophytes were removed.  The flexion gap was then measured and was symmetric with the extension gap.  Then the extramedullary tibial cutting jig was utilized making the appropriate cut using the anterior tibial crest as a reference building in appropriate posterior slope.  Care was taken during the cut to protect the medial and collateral ligaments.  The proximal tibia was removed along with the posterior horns of the menisci.  The PCL was sacrificed.    The extensor gap was measured and was approximately 9mm.    I completed the distal femoral preparation using the appropriate jig to prepare the box.  The patella was then measured, and cut with the saw.    The proximal tibia sized and prepared accordingly with the reamer and the punch, and then all components were trialed with the 9mm poly insert.  The knee was found to have excellent balance and full motion.    The above named components were then cemented into place and all excess cement was removed. Poly tibial piece and patella were inserted.  I was very happy with his stability and ROM  I performed a periarticular injection with marcaine and toradol  The knee was easily taken through a range of motion and the patella tracked well and the knee irrigated copiously and the parapatellar and subcutaneous tissue closed with vicryl, and monocryl with steri strips for the  skin.  The incision was dressed with sterile gauze and the tourniquet released and the patient was awakened and returned to the PACU in stable and satisfactory condition.  There were no complications.  Total tourniquet time was roughy 75 minutes.   POSTOPERATIVE PLAN: post op Abx, DVT px: SCD's, TED's, Early ambulation  and Chemical px

## 2016-11-15 NOTE — Transfer of Care (Signed)
Immediate Anesthesia Transfer of Care Note  Patient: [name]  Procedure(s) Performed: Procedure(s): LEFT TOTAL KNEE ARTHROPLASTY (Left)  Patient Location: PACU  Anesthesia Type:Spinal  Level of Consciousness: awake, alert  and oriented  Airway & Oxygen Therapy: Patient Spontanous Breathing and Patient connected to nasal cannula oxygen  Post-op Assessment: Report given to RN, Post -op Vital signs reviewed and stable and Patient moving all extremities  Post vital signs: Reviewed and stable  Last Vitals:  Vitals:   11/15/16 0645 11/15/16 1145  BP: (!) 149/87 (P) 122/81  Pulse: 69   Resp: 18   Temp: 36.7 C (P) 36.4 C    Last Pain:  Vitals:   11/15/16 1145  TempSrc:   PainSc: (P) 0-No pain         Complications: No apparent anesthesia complications

## 2016-11-15 NOTE — Interval H&P Note (Signed)
History and Physical Interval Note:  11/15/2016 2:39 PM  [name]  has presented today for surgery, with the diagnosis of OSTEOARTHRITIS LEFT KNEE  The various methods of treatment have been discussed with the patient and family. After consideration of risks, benefits and other options for treatment, the patient has consented to  Procedure(s): LEFT TOTAL KNEE ARTHROPLASTY (Left) as a surgical intervention .  The patient's history has been reviewed, patient examined, no change in status, stable for surgery.  I have reviewed the patient's chart and labs.  Questions were answered to the patient's satisfaction.     [name] D

## 2016-11-16 ENCOUNTER — Encounter (HOSPITAL_COMMUNITY): Payer: Self-pay | Admitting: Orthopedic Surgery

## 2016-11-16 MED ORDER — THYROID 60 MG PO TABS
90.0000 mg | ORAL_TABLET | Freq: Every day | ORAL | Status: DC
  Administered 2016-11-16: 90 mg via ORAL
  Filled 2016-11-16: qty 1

## 2016-11-16 NOTE — Evaluation (Signed)
Occupational Therapy Evaluation Patient Details Name: [name] MRN: [id number] DOB: 04/25/1966 Today's Date: 11/16/2016    History of Present Illness  Left total knee arthroplasty.     Clinical Impression   Pt doing well and is at set up level with UB ADLs, sup with ADL mobility ising RW and min A with LB ADLs. Pt will have assist from her sister at home. All education completed and no further acute OT is indicated at this time    Follow Up Recommendations  No OT follow up;Supervision - Intermittent    Equipment Recommendations  Tub/shower seat    Recommendations for Other Services       Precautions / Restrictions Precautions Precautions: Knee Precaution Comments: educated pt on no pillow or roll under knee Required Braces or Orthoses: Knee Immobilizer - Left Knee Immobilizer - Left: On when out of bed or walking Restrictions Weight Bearing Restrictions: Yes LLE Weight Bearing: Weight bearing as tolerated      Mobility Bed Mobility Overal bed mobility: Needs Assistance Bed Mobility: Supine to Sit     Supine to sit: Supervision;HOB elevated        Transfers Overall transfer level: Needs assistance Equipment used: Rolling walker (2 wheeled) Transfers: Sit to/from Stand Sit to Stand: Supervision         General transfer comment: cues for safe hand placement    Balance Overall balance assessment: No apparent balance deficits (not formally assessed)                                          ADL Overall ADL's : Needs assistance/impaired     Grooming: Wash/dry hands;Wash/dry face;Standing   Upper Body Bathing: Set up;Sitting   Lower Body Bathing: Minimal assistance   Upper Body Dressing : Set up;Sitting   Lower Body Dressing: Minimal assistance   Toilet Transfer: Supervision/safety;RW;Ambulation;Comfort height toilet   Toileting- Clothing Manipulation and Hygiene: Supervision/safety;Sit to/from stand   Tub/ Engineer, structuralhower Transfer:  3 in 1;Ambulation;Rolling walker   Functional mobility during ADLs: Supervision/safety       Vision Vision Assessment?: No apparent visual deficits              Pertinent Vitals/Pain Pain Assessment: 0-10 Pain Score: 2  Pain Location: L knee Pain Descriptors / Indicators: Aching;Sore Pain Intervention(s): Monitored during session;Premedicated before session;Repositioned     Hand Dominance Right   Extremity/Trunk Assessment Upper Extremity Assessment Upper Extremity Assessment: Overall WFL for tasks assessed       Cervical / Trunk Assessment Cervical / Trunk Assessment: Normal   Communication     Cognition Arousal/Alertness: Awake/alert Behavior During Therapy: WFL for tasks assessed/performed Overall Cognitive Status: Within Functional Limits for tasks assessed                     General Comments   pt very pleasant and cooperative                 Home Living Family/patient expects to be discharged to:: Private residence Living Arrangements: Children Available Help at Discharge: Family (sister) Type of Home: House Home Access: Stairs to enter Secretary/administratorntrance Stairs-Number of Steps: 1 Entrance Stairs-Rails: None Home Layout: One level     Bathroom Shower/Tub: Tub/shower unit;Walk-in shower   Bathroom Toilet: Standard     Home Equipment: None          Prior Functioning/Environment Level of  Independence: Independent                 OT Problem List: Decreased activity tolerance;Pain   OT Treatment/Interventions:      OT Goals(Current goals can be found in the care plan section) Acute Rehab OT Goals Patient Stated Goal: go home OT Goal Formulation: With patient  OT Frequency:     Barriers to D/C:    no barriers                     End of Session Equipment Utilized During Treatment: Gait belt;Other (comment) (3 in 1) CPM Left Knee CPM Left Knee: Off  Activity Tolerance: Patient tolerated treatment well Patient left:  in chair;with call bell/phone within reach;with family/visitor present   Time: [id number] OT Time Calculation (min): 20 min Charges:  OT General Charges $OT Visit: 1 Procedure OT Evaluation $OT Eval Moderate Complexity: 1 Procedure G-Codes:    [name], [name] 11/16/2016, 11:57 AM

## 2016-11-16 NOTE — Progress Notes (Signed)
Orthopedic Tech Progress Note Patient Details:  Florene Glenracy E Zeman 05/24/1966 [id number]  Patient ID: Florene Glenracy E Biedermann, female   DOB: 12/06/1966, 50 y.o.   MRN: [id number] Applied cpm 0-60  Trinna PostMartinez, Makael Stein J 11/16/2016, 5:13 AM

## 2016-11-16 NOTE — Discharge Instructions (Signed)
INSTRUCTIONS AFTER JOINT REPLACEMENT  ° °o Remove items at home which could result in a fall. This includes throw rugs or furniture in walking pathways °o ICE to the affected joint every three hours while awake for 30 minutes at a time, for at least the first 3-5 days, and then as needed for pain and swelling.  Continue to use ice for pain and swelling. You may notice swelling that will progress down to the foot and ankle.  This is normal after surgery.  Elevate your leg when you are not up walking on it.   °o Continue to use the breathing machine you got in the hospital (incentive spirometer) which will help keep your temperature down.  It is common for your temperature to cycle up and down following surgery, especially at night when you are not up moving around and exerting yourself.  The breathing machine keeps your lungs expanded and your temperature down. ° ° °DIET:  As you were doing prior to hospitalization, we recommend a well-balanced diet. ° °Information on my medicine - XARELTO® (Rivaroxaban) ° °This medication education was reviewed with me or my healthcare representative as part of my discharge preparation.  The pharmacist that spoke with me during my hospital stay was:  Carneshia Raker A Cali Cuartas, RPH ° °Why was Xarelto® prescribed for you? °Xarelto® was prescribed for you to reduce the risk of blood clots forming after orthopedic surgery. The medical term for these abnormal blood clots is venous thromboembolism (VTE). ° °What do you need to know about xarelto® ? °Take your Xarelto® ONCE DAILY at the same time every day. °You may take it either with or without food. ° °If you have difficulty swallowing the tablet whole, you may crush it and mix in applesauce just prior to taking your dose. ° °Take Xarelto® exactly as prescribed by your doctor and DO NOT stop taking Xarelto® without talking to the doctor who prescribed the medication.  Stopping without other VTE prevention medication to take the place of Xarelto®  may increase your risk of developing a clot. ° °After discharge, you should have regular check-up appointments with your healthcare provider that is prescribing your Xarelto®.   ° °What do you do if you miss a dose? °If you miss a dose, take it as soon as you remember on the same day then continue your regularly scheduled once daily regimen the next day. Do not take two doses of Xarelto® on the same day.  ° °Important Safety Information °A possible side effect of Xarelto® is bleeding. You should call your healthcare provider right away if you experience any of the following: °? Bleeding from an injury or your nose that does not stop. °? Unusual colored urine (red or dark brown) or unusual colored stools (red or black). °? Unusual bruising for unknown reasons. °? A serious fall or if you hit your head (even if there is no bleeding). ° °Some medicines may interact with Xarelto® and might increase your risk of bleeding while on Xarelto®. To help avoid this, consult your healthcare provider or pharmacist prior to using any new prescription or non-prescription medications, including herbals, vitamins, non-steroidal anti-inflammatory drugs (NSAIDs) and supplements. ° °This website has more information on Xarelto®: www.xarelto.com. ° ° ° °DRESSING / WOUND CARE / SHOWERING ° °Keep the surgical dressing until follow up.  IF THE DRESSING FALLS OFF or the wound gets wet inside, change the dressing with sterile gauze.  Please use good hand washing techniques before changing the dressing.  Do   not use any lotions or creams on the incision until instructed by your surgeon.   ° °ACTIVITY ° °o Increase activity slowly as tolerated, but follow the weight bearing instructions below.   °o No driving for 6 weeks or until further direction given by your physician.  You cannot drive while taking narcotics.  °o No lifting or carrying greater than 10 lbs. until further directed by your surgeon. °o Avoid periods of inactivity such as sitting  longer than an hour when not asleep. This helps prevent blood clots.  °o You may return to work once you are authorized by your doctor.  ° ° ° °WEIGHT BEARING  ° °Weight bearing as tolerated with assist device (walker, cane, etc) as directed, use it as long as suggested by your surgeon or therapist, typically at least 4-6 weeks. ° ° °EXERCISES ° °Results after joint replacement surgery are often greatly improved when you follow the exercise, range of motion and muscle strengthening exercises prescribed by your doctor. Safety measures are also important to protect the joint from further injury. Any time any of these exercises cause you to have increased pain or swelling, decrease what you are doing until you are comfortable again and then slowly increase them. If you have problems or questions, call your caregiver or physical therapist for advice.  ° °Rehabilitation is important following a joint replacement. After just a few days of immobilization, the muscles of the leg can become weakened and shrink (atrophy).  These exercises are designed to build up the tone and strength of the thigh and leg muscles and to improve motion. Often times heat used for twenty to thirty minutes before working out will loosen up your tissues and help with improving the range of motion but do not use heat for the first two weeks following surgery (sometimes heat can increase post-operative swelling).  ° °These exercises can be done on a training (exercise) mat, on the floor, on a table or on a bed. Use whatever works the best and is most comfortable for you.    Use music or television while you are exercising so that the exercises are a pleasant break in your day. This will make your life better with the exercises acting as a break in your routine that you can look forward to.   Perform all exercises about fifteen times, three times per day or as directed.  You should exercise both the operative leg and the other leg as  well. ° °Exercises include: °  °• Quad Sets - Tighten up the muscle on the front of the thigh (Quad) and hold for 5-10 seconds.   °• Straight Leg Raises - With your knee straight (if you were given a brace, keep it on), lift the leg to 60 degrees, hold for 3 seconds, and slowly lower the leg.  Perform this exercise against resistance later as your leg gets stronger.  °• Leg Slides: Lying on your back, slowly slide your foot toward your buttocks, bending your knee up off the floor (only go as far as is comfortable). Then slowly slide your foot back down until your leg is flat on the floor again.  °• Angel Wings: Lying on your back spread your legs to the side as far apart as you can without causing discomfort.  °• Hamstring Strength:  Lying on your back, push your heel against the floor with your leg straight by tightening up the muscles of your buttocks.  Repeat, but this time bend your knee   to a comfortable angle, and push your heel against the floor.  You may put a pillow under the heel to make it more comfortable if necessary.  ° °A rehabilitation program following joint replacement surgery can speed recovery and prevent re-injury in the future due to weakened muscles. Contact your doctor or a physical therapist for more information on knee rehabilitation.  ° ° °CONSTIPATION ° °Constipation is defined medically as fewer than three stools per week and severe constipation as less than one stool per week.  Even if you have a regular bowel pattern at home, your normal regimen is likely to be disrupted due to multiple reasons following surgery.  Combination of anesthesia, postoperative narcotics, change in appetite and fluid intake all can affect your bowels.  ° °YOU MUST use at least one of the following options; they are listed in order of increasing strength to get the job done.  They are all available over the counter, and you may need to use some, POSSIBLY even all of these options:   ° °Drink plenty of fluids  (prune juice may be helpful) and high fiber foods °Colace 100 mg by mouth twice a day  °Senokot for constipation as directed and as needed Dulcolax (bisacodyl), take with full glass of water  °Miralax (polyethylene glycol) once or twice a day as needed. ° °If you have tried all these things and are unable to have a bowel movement in the first 3-4 days after surgery call either your surgeon or your primary doctor.   ° °If you experience loose stools or diarrhea, hold the medications until you stool forms back up.  If your symptoms do not get better within 1 week or if they get worse, check with your doctor.  If you experience "the worst abdominal pain ever" or develop nausea or vomiting, please contact the office immediately for further recommendations for treatment. ° ° °ITCHING:  If you experience itching with your medications, try taking only a single pain pill, or even half a pain pill at a time.  You can also use Benadryl over the counter for itching or also to help with sleep.  ° °TED HOSE STOCKINGS:  Use stockings on both legs until for at least 2 weeks or as directed by physician office. They may be removed at night for sleeping. ° °MEDICATIONS:  See your medication summary on the “After Visit Summary” that nursing will review with you.  You may have some home medications which will be placed on hold until you complete the course of blood thinner medication.  It is important for you to complete the blood thinner medication as prescribed. ° °PRECAUTIONS:  If you experience chest pain or shortness of breath - call 911 immediately for transfer to the hospital emergency department.  ° °If you develop a fever greater that 101 F, purulent drainage from wound, increased redness or drainage from wound, foul odor from the wound/dressing, or calf pain - CONTACT YOUR SURGEON.   °                                                °FOLLOW-UP APPOINTMENTS:  If you do not already have a post-op appointment, please call the  office for an appointment to be seen by your surgeon.  Guidelines for how soon to be seen are listed in your “After Visit Summary”, but   are typically between 1-4 weeks after surgery. ° °OTHER INSTRUCTIONS:  ° °Knee Replacement:  Do not place pillow under knee, focus on keeping the knee straight while resting. CPM instructions: 0-90 degrees, 2 hours in the morning, 2 hours in the afternoon, and 2 hours in the evening. Place foam block, curve side up under heel at all times except when in CPM or when walking.  DO NOT modify, tear, cut, or change the foam block in any way. ° °MAKE SURE YOU:  °• Understand these instructions.  °• Get help right away if you are not doing well or get worse.  ° ° °Thank you for letting us be a part of your medical care team.  It is a privilege we respect greatly.  We hope these instructions will help you stay on track for a fast and full recovery!  ° ° °

## 2016-11-16 NOTE — Progress Notes (Signed)
Patient Discharge: Disposition: Patient discharged to home. Education: Reviewed the medications, prescriptions, follow-up appointment and discharge instructions, understood and acknowledged. IV: Discontinued before discharge. Transportation: Patient escorted out of the unit in w/c accompanied by the staff. Belongings: Patient took all her belongings with her.

## 2016-11-16 NOTE — Care Management Note (Signed)
Case Management Note  Patient Details  Name: [name] MRN: [id number] Date of Birth: 09/17/1966  Subjective/Objective:   50 yr old female s/p  Left total knee arthroplasty.                Action/Plan: Case manager spoke with patient concerning discharge plan and DME needs. Patient was preoperatively [facility] with [facility], no changes. Rolling walker, 3in1 and CPM have been delivered to patient's home. Her sister will be assisting her at discharge.    Expected Discharge Date:    11/16/16              Expected Discharge Plan:  Home w Home Health Services  In-House Referral:  NA  Discharge planning Services  CM Consult  Post Acute Care Choice:  Durable Medical Equipment, Home Health Choice offered to:  Patient  DME Arranged:  Walker rolling DME Agency:  [facility]  [facility]  Status of Service:  Completed, signed off  If discussed at Long Length of Stay Meetings, dates discussed:    Additional Comments:  [name], [name], RN 11/16/2016, 11:11 AM

## 2016-11-16 NOTE — Discharge Summary (Signed)
Discharge Summary  Patient ID: [name] MRN: [id number] DOB/AGE: 50/06/1966 50 y.o.  Admit date: 11/15/2016 Discharge date: 11/16/2016  Admission Diagnoses:  Primary osteoarthritis of left knee  Discharge Diagnoses:  Principal Problem:   Primary osteoarthritis of left knee Active Problems:   Essential hypertension   Hyperlipidemia   Past Medical History:  Diagnosis Date  . Headache    migraines  . Heart murmur   . Hypertension   . Hypothyroidism   . Obesity (BMI 30.0-34.9)   . PONV (postoperative nausea and vomiting)   . Primary osteoarthritis of left knee   . Wears contact lenses     Surgeries: Procedure(s): LEFT TOTAL KNEE ARTHROPLASTY on 11/15/2016   Consultants (if any):   Discharged Condition: Improved  Progress  Subjective: Feeling well.  OOB in room.  Pain controlled with PO meds.  Tolerating diet.  Urinating.  No CP, SOB.  Objective: General: NAD.  Upright in bed  Resp: No increased WOB Cardio: regular rate and rhythm ABD soft Neurologically intact MSK Neurovascularly intact Sensation intact distally Intact pulses distally Dorsiflexion/Plantar flexion intact Incision: dressing C/D/I  Plan: Up with therapy D/C IV fluids Weight Bearing: Weight Bearing as Tolerated (WBAT) left leg Dressings: Prn.  VTE prophylaxis: Xarelto, ambulation, SCDs Dispo: Home today   Hospital Course: [name] is an 50 y.o. female who was admitted 11/15/2016 with a diagnosis of Primary osteoarthritis of left knee and went to the operating room on 11/15/2016 and underwent the above named procedures.    She was given perioperative antibiotics:  Anti-infectives    Start     Dose/Rate Route Frequency Ordered Stop   11/15/16 1645  ceFAZolin (ANCEF) IVPB 2g/100 mL premix     2 g 200 mL/hr over 30 Minutes Intravenous Every 6 hours 11/15/16 1611 11/15/16 2328   11/15/16 0633  ceFAZolin (ANCEF) 2-4 GM/100ML-% IVPB    Comments:  Rosenberger, Meredit: cabinet override       11/15/16 0633 11/15/16 0920   11/15/16 0630  ceFAZolin (ANCEF) IVPB 2g/100 mL premix     2 g 200 mL/hr over 30 Minutes Intravenous On call to O.R. 11/15/16 0630 11/15/16 0930    .  She was given sequential compression devices, early ambulation, and Xarelto for DVT prophylaxis.  She benefited maximally from the hospital stay and there were no complications.    Recent vital signs:  Vitals:   11/16/16 0000 11/16/16 0630  BP: 107/64 125/64  Pulse: 70 68  Resp: 16 16  Temp: 98.4 F (36.9 C) 98.6 F (37 C)    Recent laboratory studies:  Lab Results  Component Value Date   HGB 14.4 11/04/2016   Lab Results  Component Value Date   WBC 9.0 11/04/2016   PLT 366 11/04/2016   Lab Results  Component Value Date   INR 1.00 11/04/2016   Lab Results  Component Value Date   NA 137 11/04/2016   K 4.3 11/04/2016   CL 102 11/04/2016   CO2 26 11/04/2016   BUN 16 11/04/2016   CREATININE 0.94 11/04/2016   GLUCOSE 104 (H) 11/04/2016    Discharge Medications:     Medication List    TAKE these medications   acetaminophen 500 MG tablet Commonly known as:  TYLENOL Take 500-1,000 mg by mouth every 6 (six) hours as needed (for pain).   ADIPEX-P 37.5 MG tablet Generic drug:  phentermine Take 37.5 mg by mouth daily before breakfast.   amLODipine 5 MG tablet Commonly known as:  NORVASC Take 5 mg by mouth daily.   dextromethorphan-guaiFENesin 30-600 MG 12hr tablet Commonly known as:  MUCINEX DM Take 1 tablet by mouth 2 (two) times daily. With tylenol 325mg    DHEA PO Take 1 capsule by mouth daily. Patient is unsure of dose   docusate sodium 100 MG capsule Commonly known as:  COLACE Take 1 capsule (100 mg total) by mouth 2 (two) times daily.   DUEXIS 800-26.6 MG Tabs Generic drug:  Ibuprofen-Famotidine Take 1 tablet by mouth 3 (three) times daily as needed for pain.   estradiol 1 MG tablet Commonly known as:  ESTRACE Take 1 mg by mouth at bedtime.   fluticasone 50  MCG/ACT nasal spray Commonly known as:  FLONASE Place 1-2 sprays into both nostrils daily as needed for allergies.   hydrocortisone 2.5 % cream Apply 1 application topically 3 (three) times daily as needed (for skin irritation).   meclizine 25 MG tablet Commonly known as:  ANTIVERT Take 12.5-25 mg by mouth 3 (three) times daily as needed for dizziness.   methocarbamol 500 MG tablet Commonly known as:  ROBAXIN Take 1 tablet (500 mg total) by mouth every 6 (six) hours as needed for muscle spasms.   naloxegol oxalate 25 MG Tabs tablet Commonly known as:  MOVANTIK Take 1 tablet (25 mg total) by mouth daily.   NATURE-THROID 32.5 MG tablet Generic drug:  thyroid Take 97.5 mg by mouth daily.   ondansetron 4 MG tablet Commonly known as:  ZOFRAN Take 1 tablet (4 mg total) by mouth every 8 (eight) hours as needed for nausea or vomiting.   oxyCODONE-acetaminophen 5-325 MG tablet Commonly known as:  ROXICET Take 1-2 tablets by mouth every 4 (four) hours as needed for severe pain.   Pregnenolone Powd Take 1 capsule by mouth daily. Patient is unsure of dose   progesterone 100 MG capsule Commonly known as:  PROMETRIUM Take 200 mg by mouth at bedtime.   promethazine 25 MG tablet Commonly known as:  PHENERGAN Take 25 mg by mouth every 6 (six) hours as needed for nausea or vomiting.   rivaroxaban 10 MG Tabs tablet Commonly known as:  XARELTO Take 1 tablet (10 mg total) by mouth daily.   TURMERIC PO Take 1 capsule by mouth daily. Patient is unsure of dose   VITAMIN D3 PO Take 1 tablet by mouth daily. Patient is unsure of dose       Diagnostic Studies: Dg Knee Left Port  Result Date: 11/15/2016 CLINICAL DATA:  Postop total knee replacement. EXAM: PORTABLE LEFT KNEE - 1-2 VIEW COMPARISON:  None. FINDINGS: Total knee arthroplasty. Components appear well positioned. Tibial tubercle region screw anchor. No radiographically detectable complication. IMPRESSION: No expected findings  following total knee arthroplasty with apparent patellar tendon anchor. Electronically Signed   By: Paulina FusiMark  Shogry M.D.   On: 11/15/2016 13:12    Disposition:     Follow-up Information    MURPHY, TIMOTHY D, MD Follow up.   Specialty:  Orthopedic Surgery Contact information: [address] [place] [id number] [phone]            Signed: Albina BilletHenry Calvin Martensen III PA-C 11/16/2016, 8:03 AM

## 2016-11-16 NOTE — Progress Notes (Signed)
Orthopedic Tech Progress Note Patient Details:  [name] 04/15/1966 [id number]  Patient ID: [name], female   DOB: 08/13/1966, 50 y.o.   MRN: [id number]   Nikki DomCrawford, Ajla Mcgeachy 11/16/2016, 2:03 PM Placed pt's lle on cpm @0 -60 degrees @1400 ; RN notified

## 2016-11-16 NOTE — Evaluation (Signed)
Physical Therapy Evaluation Patient Details Name: [name] E Wollin MRN: [id number] DOB: 06/23/1966 Today's Date: 11/16/2016   History of Present Illness   Left total knee arthroplasty.    Clinical Impression  Pt is s/p TKA resulting in the deficits listed below (see PT Problem List).  Pt will benefit from skilled PT to increase their independence and safety with mobility to allow discharge to the venue listed below. pt should progress well     Follow Up Recommendations Home health PT    Equipment Recommendations  Rolling walker with 5" wheels    Recommendations for Other Services       Precautions / Restrictions Precautions Precautions: Knee Precaution Comments: educated pt on no pillow or roll under knee Required Braces or Orthoses: Knee Immobilizer - Left Knee Immobilizer - Left: On when out of bed or walking Restrictions Weight Bearing Restrictions: Yes LLE Weight Bearing: Weight bearing as tolerated      Mobility  Bed Mobility Overal bed mobility: Needs Assistance Bed Mobility: Sit to Supine     Supine to sit: Supervision;HOB elevated Sit to supine: Supervision   General bed mobility comments: for safety  Transfers Overall transfer level: Needs assistance Equipment used: Rolling walker (2 wheeled) Transfers: Sit to/from Stand Sit to Stand: Supervision         General transfer comment: cues for safe hand placement, LLE position  Ambulation/Gait Ambulation/Gait assistance: Min guard Ambulation Distance (Feet): 80 Feet Assistive device: Rolling walker (2 wheeled) Gait Pattern/deviations: Step-to pattern;Step-through pattern     General Gait Details: cues for RW position from self and gait progression  Stairs Stairs:  (demonstrated and verbally reviewed stair technique and safety)          Wheelchair Mobility    Modified Rankin (Stroke Patients Only)       Balance Overall balance assessment: No apparent balance deficits (not formally  assessed)                                           Pertinent Vitals/Pain Pain Assessment: 0-10 Pain Score: 4  Pain Location: L knee Pain Descriptors / Indicators: Aching;Sore Pain Intervention(s): Limited activity within patient's tolerance;Monitored during session;Repositioned    Home Living Family/patient expects to be discharged to:: Private residence Living Arrangements: Children Available Help at Discharge: Family Type of Home: House Home Access: Stairs to enter Entrance Stairs-Rails: None Entrance Stairs-Number of Steps: 1 large step or 2 separated small ones Home Layout: One level Home Equipment: None      Prior Function Level of Independence: Independent               Hand Dominance   Dominant Hand: Right    Extremity/Trunk Assessment   Upper Extremity Assessment: Defer to OT evaluation;Overall WFL for tasks assessed           Lower Extremity Assessment: LLE deficits/detail   LLE Deficits / Details: hip flexion and knee extension 3/5, knee flexion- AAROM -5* to ~60*; ankle WFL  Cervical / Trunk Assessment: Normal  Communication   Communication: No difficulties  Cognition Arousal/Alertness: Awake/alert Behavior During Therapy: WFL for tasks assessed/performed Overall Cognitive Status: Within Functional Limits for tasks assessed                      General Comments      Exercises Total Joint Exercises Ankle Circles/Pumps: AROM;Both;10 reps;Supine Quad Sets:  10 reps;Both;AROM;Supine Heel Slides: AAROM;Left;10 reps Hip ABduction/ADduction: AROM;Strengthening;Left;10 reps;Supine Straight Leg Raises: 10 reps;Supine;AROM;Left   Assessment/Plan    PT Assessment Patient needs continued PT services  PT Problem List Decreased strength;Decreased range of motion;Decreased knowledge of precautions;Decreased knowledge of use of DME          PT Treatment Interventions Functional mobility training;Gait training;DME  instruction;Therapeutic activities;Therapeutic exercise;Patient/family education;Stair training    PT Goals (Current goals can be found in the Care Plan section)  Acute Rehab PT Goals Patient Stated Goal: go home PT Goal Formulation: With patient Time For Goal Achievement: 11/21/16 Potential to Achieve Goals: Good    Frequency 7X/week   Barriers to discharge        Co-evaluation               End of Session Equipment Utilized During Treatment: Gait belt Activity Tolerance: Patient tolerated treatment well Patient left: in bed;with call bell/phone within reach;with bed alarm set;with nursing/sitter in room           Time: [id number] PT Time Calculation (min) (ACUTE ONLY): 21 min   Charges:   PT Evaluation $PT Eval Low Complexity: 1 Procedure     PT G Codes:        Kenady Doxtater 11/16/2016, 2:20 PM

## 2018-07-29 IMAGING — CR DG KNEE 1-2V PORT*L*
3 series · 3 of 3 positions shown · non-contrast
Comparison: None.

CLINICAL DATA: Postop total knee replacement.

EXAM:
PORTABLE LEFT KNEE - 1-2 VIEW

[ap]
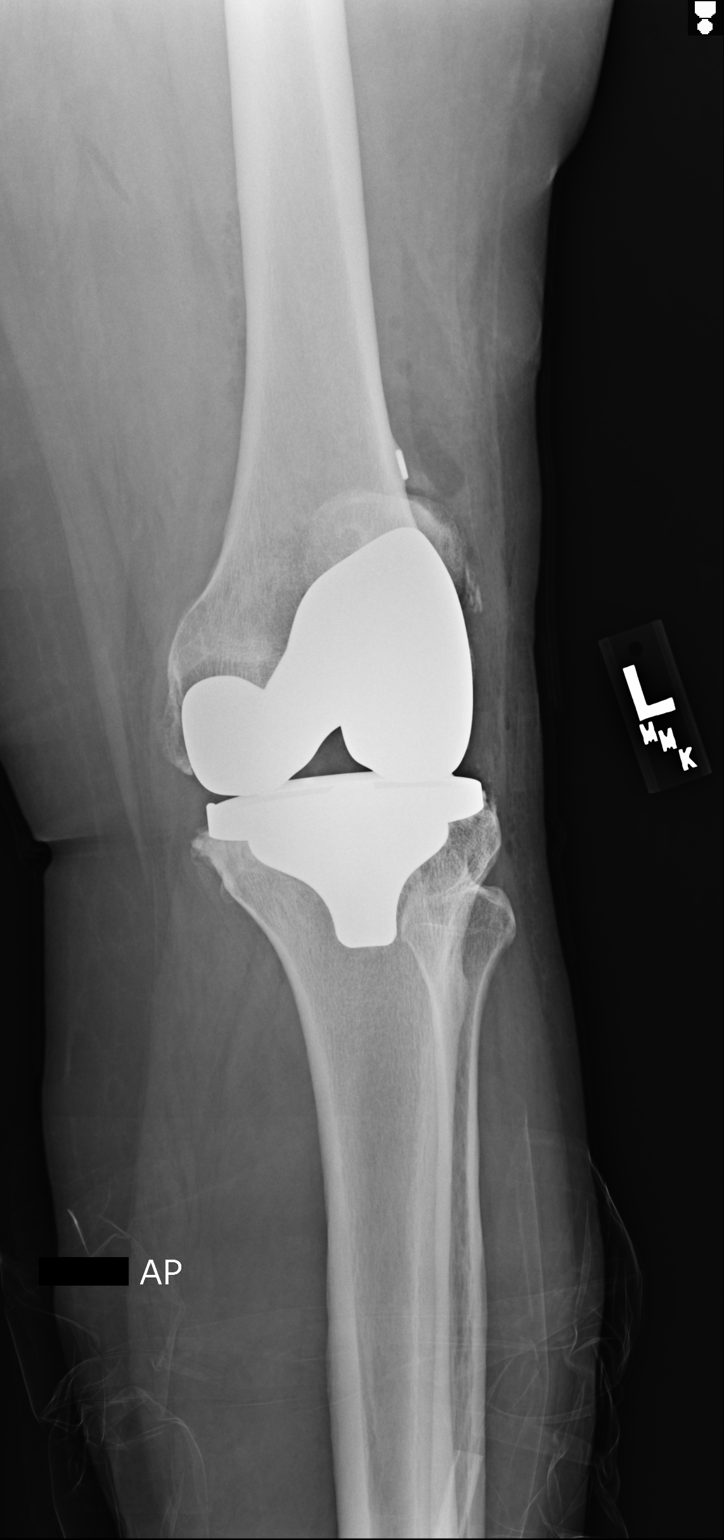

[oblique]
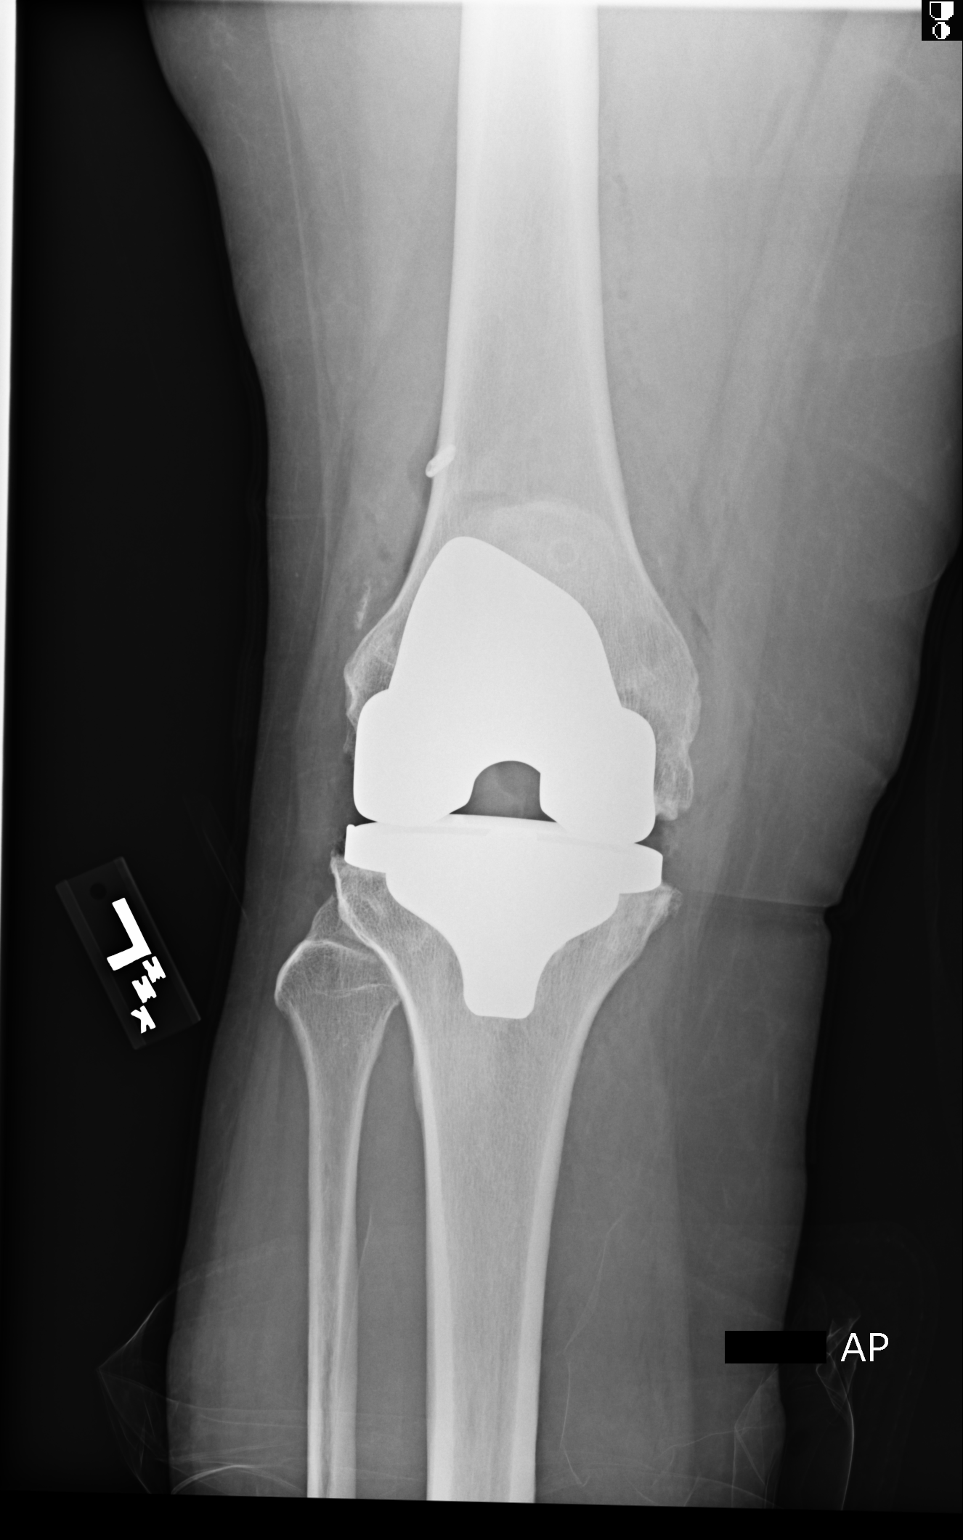

[sun rise]
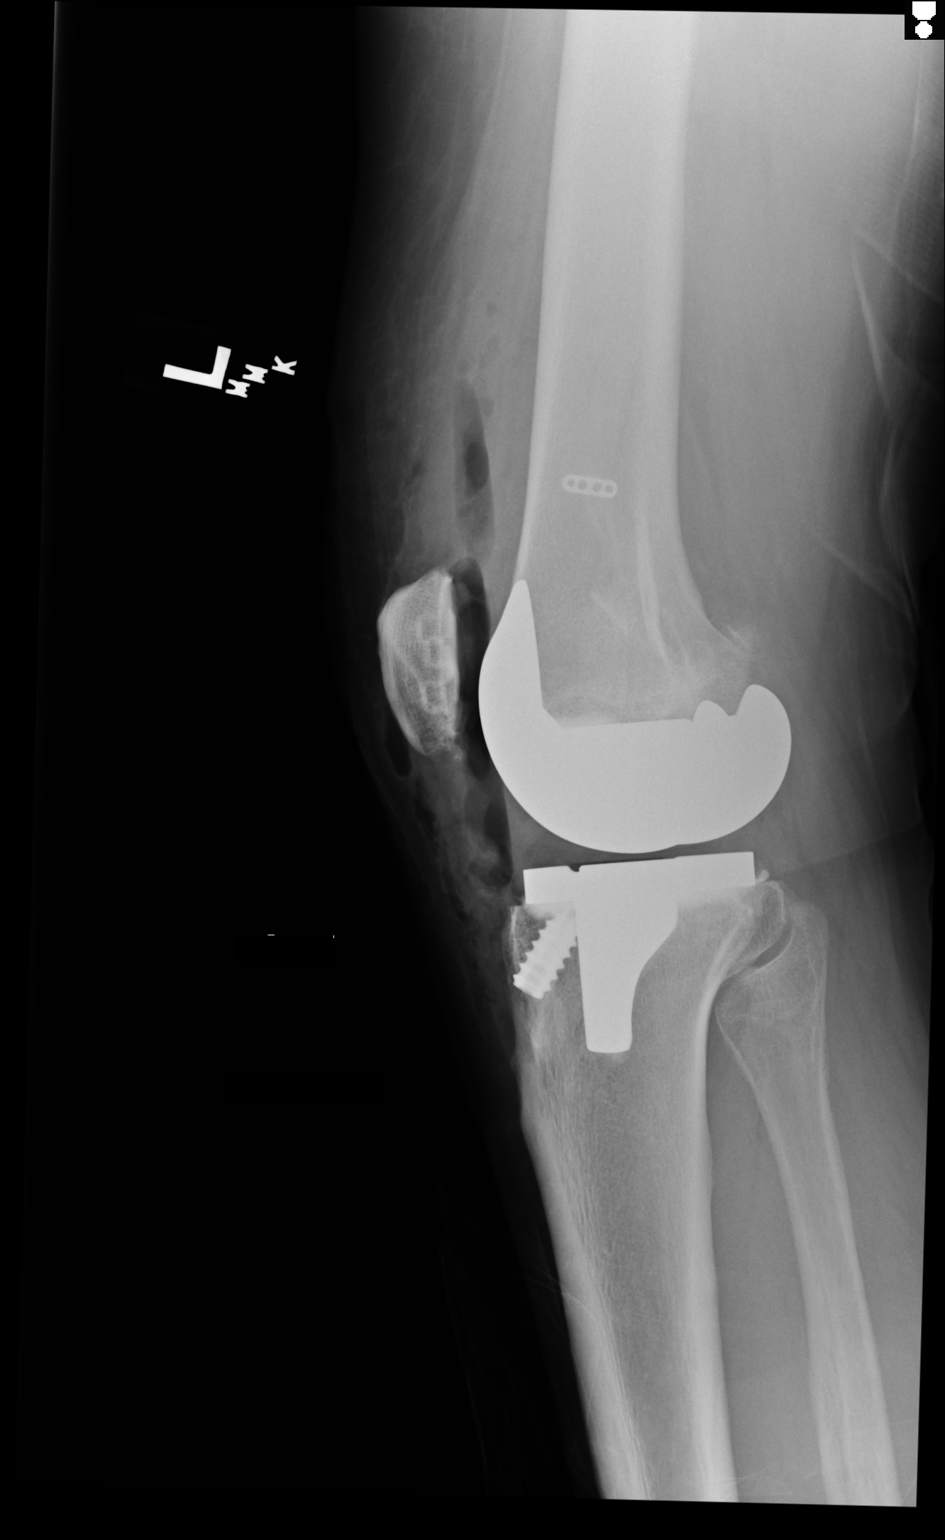

[3 of 3 positions shown; findings below may reference images not displayed]

FINDINGS: Total knee arthroplasty. Components appear well positioned. Tibial
tubercle region screw anchor. No radiographically detectable
complication.
IMPRESSION: No expected findings following total knee arthroplasty with apparent
patellar tendon anchor.
